# Patient Record
Sex: Female | Born: 1940 | Race: White | Hispanic: No | Marital: Married | Smoking: Never smoker
Health system: Southern US, Community
[De-identification: ages and names within clinical notes are randomized; demographics above are authoritative.]

## PROBLEM LIST (undated history)

## (undated) DIAGNOSIS — K623 Rectal prolapse: Secondary | ICD-10-CM

## (undated) DIAGNOSIS — E119 Type 2 diabetes mellitus without complications: Secondary | ICD-10-CM

## (undated) DIAGNOSIS — N390 Urinary tract infection, site not specified: Secondary | ICD-10-CM

## (undated) HISTORY — PX: BLADDER SUSPENSION: SHX72

## (undated) HISTORY — PX: ABDOMINAL HYSTERECTOMY: SHX81

## (undated) HISTORY — PX: CHOLECYSTECTOMY: SHX55

---

## 2004-03-25 ENCOUNTER — Ambulatory Visit (HOSPITAL_COMMUNITY): Admission: RE | Admit: 2004-03-25 | Discharge: 2004-03-25 | Payer: Self-pay | Admitting: Ophthalmology

## 2015-04-05 ENCOUNTER — Other Ambulatory Visit (HOSPITAL_COMMUNITY): Payer: Self-pay

## 2015-04-05 ENCOUNTER — Inpatient Hospital Stay (HOSPITAL_COMMUNITY)
Admission: EM | Admit: 2015-04-05 | Discharge: 2015-04-10 | DRG: 684 | Disposition: A | Discharge status: Home Health | Payer: Medicare PPO | Attending: Family Medicine | Admitting: Family Medicine

## 2015-04-05 ENCOUNTER — Inpatient Hospital Stay (HOSPITAL_COMMUNITY): Payer: Medicare PPO

## 2015-04-05 ENCOUNTER — Encounter (HOSPITAL_COMMUNITY): Payer: Self-pay | Admitting: Emergency Medicine

## 2015-04-05 DIAGNOSIS — R531 Weakness: Secondary | ICD-10-CM

## 2015-04-05 DIAGNOSIS — E1121 Type 2 diabetes mellitus with diabetic nephropathy: Secondary | ICD-10-CM | POA: Diagnosis present

## 2015-04-05 DIAGNOSIS — E11319 Type 2 diabetes mellitus with unspecified diabetic retinopathy without macular edema: Secondary | ICD-10-CM | POA: Diagnosis present

## 2015-04-05 DIAGNOSIS — Z794 Long term (current) use of insulin: Secondary | ICD-10-CM | POA: Diagnosis not present

## 2015-04-05 DIAGNOSIS — N179 Acute kidney failure, unspecified: Secondary | ICD-10-CM | POA: Diagnosis present

## 2015-04-05 DIAGNOSIS — E860 Dehydration: Secondary | ICD-10-CM | POA: Diagnosis present

## 2015-04-05 DIAGNOSIS — R600 Localized edema: Secondary | ICD-10-CM | POA: Diagnosis present

## 2015-04-05 DIAGNOSIS — G20 Parkinson's disease: Secondary | ICD-10-CM | POA: Diagnosis present

## 2015-04-05 DIAGNOSIS — R55 Syncope and collapse: Secondary | ICD-10-CM | POA: Diagnosis not present

## 2015-04-05 DIAGNOSIS — Z90710 Acquired absence of both cervix and uterus: Secondary | ICD-10-CM

## 2015-04-05 DIAGNOSIS — I951 Orthostatic hypotension: Secondary | ICD-10-CM | POA: Diagnosis present

## 2015-04-05 DIAGNOSIS — E1165 Type 2 diabetes mellitus with hyperglycemia: Secondary | ICD-10-CM | POA: Diagnosis present

## 2015-04-05 DIAGNOSIS — E11649 Type 2 diabetes mellitus with hypoglycemia without coma: Secondary | ICD-10-CM | POA: Diagnosis not present

## 2015-04-05 DIAGNOSIS — IMO0002 Reserved for concepts with insufficient information to code with codable children: Secondary | ICD-10-CM | POA: Diagnosis present

## 2015-04-05 HISTORY — DX: Rectal prolapse: K62.3

## 2015-04-05 HISTORY — DX: Type 2 diabetes mellitus without complications: E11.9

## 2015-04-05 HISTORY — DX: Urinary tract infection, site not specified: N39.0

## 2015-04-05 LAB — CBC WITH DIFFERENTIAL/PLATELET
BASOS ABS: 0 10*3/uL (ref 0.0–0.1)
BASOS PCT: 1 % (ref 0–1)
EOS PCT: 2 % (ref 0–5)
Eosinophils Absolute: 0.1 10*3/uL (ref 0.0–0.7)
HCT: 38.2 % (ref 36.0–46.0)
HEMOGLOBIN: 12.8 g/dL (ref 12.0–15.0)
LYMPHS ABS: 1.6 10*3/uL (ref 0.7–4.0)
LYMPHS PCT: 28 % (ref 12–46)
MCH: 31.2 pg (ref 26.0–34.0)
MCHC: 33.5 g/dL (ref 30.0–36.0)
MCV: 93.2 fL (ref 78.0–100.0)
Monocytes Absolute: 0.4 10*3/uL (ref 0.1–1.0)
Monocytes Relative: 6 % (ref 3–12)
NEUTROS ABS: 3.7 10*3/uL (ref 1.7–7.7)
NEUTROS PCT: 63 % (ref 43–77)
Platelets: 150 10*3/uL (ref 150–400)
RBC: 4.1 MIL/uL (ref 3.87–5.11)
RDW: 14.6 % (ref 11.5–15.5)
WBC: 5.8 10*3/uL (ref 4.0–10.5)

## 2015-04-05 LAB — URINALYSIS, ROUTINE W REFLEX MICROSCOPIC
Bilirubin Urine: NEGATIVE
Glucose, UA: 100 mg/dL — AB
HGB URINE DIPSTICK: NEGATIVE
Ketones, ur: NEGATIVE mg/dL
NITRITE: NEGATIVE
PH: 5 (ref 5.0–8.0)
Protein, ur: NEGATIVE mg/dL
Specific Gravity, Urine: 1.012 (ref 1.005–1.030)
UROBILINOGEN UA: 0.2 mg/dL (ref 0.0–1.0)

## 2015-04-05 LAB — COMPREHENSIVE METABOLIC PANEL
ALBUMIN: 3.8 g/dL (ref 3.5–5.0)
ALT: 22 U/L (ref 14–54)
ANION GAP: 11 (ref 5–15)
AST: 61 U/L — AB (ref 15–41)
Alkaline Phosphatase: 81 U/L (ref 38–126)
BILIRUBIN TOTAL: 0.2 mg/dL — AB (ref 0.3–1.2)
BUN: 54 mg/dL — ABNORMAL HIGH (ref 6–20)
CALCIUM: 10.2 mg/dL (ref 8.9–10.3)
CHLORIDE: 103 mmol/L (ref 101–111)
CO2: 24 mmol/L (ref 22–32)
CREATININE: 2.4 mg/dL — AB (ref 0.44–1.00)
GFR calc Af Amer: 22 mL/min — ABNORMAL LOW (ref >60)
GFR calc non Af Amer: 19 mL/min — ABNORMAL LOW (ref >60)
Glucose, Bld: 294 mg/dL — ABNORMAL HIGH (ref 65–99)
Potassium: 4.5 mmol/L (ref 3.5–5.1)
SODIUM: 138 mmol/L (ref 135–145)
Total Protein: 7.2 g/dL (ref 6.5–8.1)

## 2015-04-05 LAB — GLUCOSE, CAPILLARY: GLUCOSE-CAPILLARY: 154 mg/dL — AB (ref 65–99)

## 2015-04-05 LAB — URINE MICROSCOPIC-ADD ON

## 2015-04-05 MED ORDER — ENOXAPARIN SODIUM 40 MG/0.4ML ~~LOC~~ SOLN
40.0000 mg | SUBCUTANEOUS | Status: DC
  Administered 2015-04-05 – 2015-04-09 (×5): 40 mg via SUBCUTANEOUS
  Filled 2015-04-05 (×6): qty 0.4

## 2015-04-05 MED ORDER — SODIUM CHLORIDE 0.9 % IV BOLUS (SEPSIS)
1000.0000 mL | Freq: Once | INTRAVENOUS | Status: AC
  Administered 2015-04-05: 1000 mL via INTRAVENOUS

## 2015-04-05 MED ORDER — INSULIN ASPART 100 UNIT/ML ~~LOC~~ SOLN
0.0000 [IU] | Freq: Three times a day (TID) | SUBCUTANEOUS | Status: DC
  Administered 2015-04-06 (×2): 5 [IU] via SUBCUTANEOUS
  Administered 2015-04-07: 2 [IU] via SUBCUTANEOUS
  Administered 2015-04-07: 3 [IU] via SUBCUTANEOUS
  Administered 2015-04-07: 2 [IU] via SUBCUTANEOUS
  Administered 2015-04-08: 3 [IU] via SUBCUTANEOUS
  Administered 2015-04-08: 7 [IU] via SUBCUTANEOUS
  Administered 2015-04-08 – 2015-04-09 (×2): 3 [IU] via SUBCUTANEOUS
  Administered 2015-04-09: 5 [IU] via SUBCUTANEOUS
  Administered 2015-04-09 – 2015-04-10 (×2): 2 [IU] via SUBCUTANEOUS
  Administered 2015-04-10: 7 [IU] via SUBCUTANEOUS

## 2015-04-05 MED ORDER — ACETAMINOPHEN 325 MG PO TABS: 650.0000 mg | ORAL_TABLET | Freq: Four times a day (QID) | ORAL | Status: DC | PRN

## 2015-04-05 MED ORDER — SODIUM CHLORIDE 0.9 % IV SOLN
INTRAVENOUS | Status: AC
  Administered 2015-04-05: 23:00:00 via INTRAVENOUS

## 2015-04-05 MED ORDER — ONDANSETRON HCL 4 MG PO TABS: 4.0000 mg | ORAL_TABLET | Freq: Four times a day (QID) | ORAL | Status: DC | PRN

## 2015-04-05 MED ORDER — INSULIN DETEMIR 100 UNIT/ML ~~LOC~~ SOLN
45.0000 [IU] | Freq: Every day | SUBCUTANEOUS | Status: DC
  Administered 2015-04-05: 45 [IU] via SUBCUTANEOUS
  Filled 2015-04-05 (×2): qty 0.45

## 2015-04-05 MED ORDER — ACETAMINOPHEN 650 MG RE SUPP: 650.0000 mg | Freq: Four times a day (QID) | RECTAL | Status: DC | PRN

## 2015-04-05 MED ORDER — ONDANSETRON HCL 4 MG/2ML IJ SOLN: 4.0000 mg | Freq: Four times a day (QID) | INTRAMUSCULAR | Status: DC | PRN

## 2015-04-05 NOTE — ED Notes (Addendum)
Pt went to primary care MD this am, had labs drawn and was told to go to the hospital--pt has been weak for approx 1 month, not eating/drinking as much. Pt is having urinary frequency. Pt needs assistance in standing and walking-- in past month.

## 2015-04-05 NOTE — H&P (Addendum)
[facility] History and Physical  Cassandra Mortonsther Fahmy ZOX:[id number] DOB: 1941/06/17 DOA: 04/05/2015  Referring physician: Mr.Browning. PCP: No PCP Per Patient [facility]. Specialists: [facility].  Chief Complaint: Elevated creatinine.  HPI: Cassandra Thompson is a 74 y.o. 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 Patient's baseline creatinine as per the family was around 1.3. Patient was found to be having Around 2.4 which was confirmed in the ER. Patient states over the last few days patient has been feeling weak. Patient has been going to neurologist for gait imbalance. Patient's first visit was in January of this year and was referred to the physical therapist. Since patient's weakness persisted patient had gone to the neurologist again in April 21 of this year and as per the family had MRI of the brain which was unremarkable as per the patient's family. Patient was eventually placed on Sinemet on April 25, last month. Over the last 4 days patient is finding it increasingly difficult to ambulate generalized weakness. Patient required wheelchair to walk. Patient had gone to her PCP yesterday and had lab works done and was told to decrease her Sinemet dose. Lab work showed elevated creatinine and was referred to the ER. Patient denies any nausea vomiting abdominal pain fever chills headache visual symptoms or any loss of consciousness. On exam patient is able to move all extremities. And patient is orthostatic with blood pressure dropping from 155 systolic to 125 systolic. UA is unremarkable. As per the family patient's UA showed some blood cells at the PCPs office. Patient was treated for shingles last month. Patient has chronic lower extremity numbness  from diabetes. Patient also sees ophthalmologist for diabetic retinopathy and gets injections.  Review of Systems: As presented in the history of presenting illness, rest negative.  Past Medical History  Diagnosis Date  . Diabetes mellitus without complication   . UTI (lower urinary tract infection)   . Rectal prolapse    Past Surgical History  Procedure Laterality Date  . Abdominal hysterectomy    . Bladder suspension    . Cholecystectomy     Social History:  reports that she has never smoked. She does not have any smokeless tobacco history on file. She reports that she does not drink alcohol or use illicit drugs. Where does patient live at home. Can patient participate in ADLs? Yes.  Not on File  Family History:  Family History  Problem Relation Age of Onset  . CAD Mother   . Diabetes Mellitus II Father       Prior to Admission medications   Not on File    Physical Exam: Filed Vitals:   04/05/15 2049 04/05/15 2125 04/05/15 2127 04/05/15 2130  BP: 154/66 155/72 147/81 123/62  Pulse: 86 84 89 90  Temp: 98.3 F (36.8 C)     TempSrc: Oral     Resp: 18 18    SpO2: 97% 99% 99% 100%     General:  Well-developed and nourished. Patient appears dry.  Eyes: Anicteric no pallor.  ENT: No discharge from the ears eyes nose and mouth.  Neck: No mass felt. No JVD appreciated.  Cardiovascular: S1 and S2 heard.  Respiratory: No rhonchi or crepitations.  Abdomen: Soft nontender bowel sounds present.  Skin: Chronic skin changes in the lower  extremity.  Musculoskeletal: No edema.  Psychiatric: Appears normal.  Neurologic: Alert awake oriented to time place and person. Moves all extremities with 5 x 5 but has generalized weakness and patient finds it difficult to stand up. No facial asymmetry. PERRLA positive. Tongue is midline. Deep tendon reflexes are absent.  Labs on Admission:  Basic Metabolic Panel:  Recent Labs Lab 04/05/15 1520  NA 138  K 4.5  CL 103   CO2 24  GLUCOSE 294*  BUN 54*  CREATININE 2.40*  CALCIUM 10.2   Liver Function Tests:  Recent Labs Lab 04/05/15 1520  AST 61*  ALT 22  ALKPHOS 81  BILITOT 0.2*  PROT 7.2  ALBUMIN 3.8   No results for input(s): LIPASE, AMYLASE in the last 168 hours. No results for input(s): AMMONIA in the last 168 hours. CBC:  Recent Labs Lab 04/05/15 1520  WBC 5.8  NEUTROABS 3.7  HGB 12.8  HCT 38.2  MCV 93.2  PLT 150   Cardiac Enzymes: No results for input(s): CKTOTAL, CKMB, CKMBINDEX, TROPONINI in the last 168 hours.  BNP (last 3 results) No results for input(s): BNP in the last 8760 hours.  ProBNP (last 3 results) No results for input(s): PROBNP in the last 8760 hours.  CBG: No results for input(s): GLUCAP in the last 168 hours.  Radiological Exams on Admission: No results found.  EKG: Independently reviewed. Normal sinus rhythm with RBBB.  Assessment/Plan Active Problems:   AKI (acute kidney injury)   Diabetes mellitus type 2, uncontrolled   Generalized weakness   Orthostatic hypotension   Acute renal failure   1. Acute renal failure - patient is clearly orthostatic at this time and on exam patient is feeling weak and fatigued. I suspect patient's renal failure could be prerenal but patient also has history of retinopathy. At this time we will hold off patient's Lasix and gently hydrate. Patient did receive 1 L normal saline bolus in the ER. Check renal sonogram. Check urine creatinine and sodium. Urine does not show any proteinuria. 2. Generalized weakness been fatigue - as per the patient patient had normal MRI brain last month. Patient was placed on Sinemet last month by patient's neurologist. Patient's PCP had advised to decrease his Sinemet yesterday. Sinemet dose is unclear at this time. Patient is clearly orthostatic and suspect patient's weakness could be also from dehydration. At this time we will gently hydrate get physical therapy consult. Recheck  orthostatics after hydration. Will discuss with neurologist. Check TSH for hypothyroidism and CK levels for any myositis. 3. Diabetes mellitus type 2 uncontrolled - patient states her last hemoglobin A1c was around 7.5. At this time we'll continue home medication of intermittent but hold metformin due to renal failure and place patient on sliding scale coverage. Closely follow CBGs. 4. History of lower extremity edema - holding of Lasix due to renal failure.  Addendum - I have discussed with Dr. Hosie Poisson on call neurologist. Dr. Hosie Poisson has advised to continue Sinemet at the dose patient was taking and follow-up with neurologist. Patient on reexam as already started walking after hydration. Closely observe for now. Recheck CK levels as it was mildly elevated. Patient's family updated.   DVT Prophylaxis Lovenox.  Code Status: Full code.  Family Communication: Patient's husband and daughter.  Disposition Plan: Admit to inpatient. Likely stay 2-3 days.    Cardarius Senat N. [facility] Pager [phone].  If 7PM-7AM, please contact night-coverage [email or web address] Password [facility] 04/05/2015, 9:49 PM

## 2015-04-05 NOTE — ED Provider Notes (Signed)
CSN: [id number]     Arrival date & time 04/05/15  1401 History   First MD Initiated Contact with Patient 04/05/15 1644     Chief Complaint  Patient presents with  . Weakness  . abnormal labs      (Consider location/radiation/quality/duration/timing/severity/associated sxs/prior Treatment) HPI Comments: Patient presents to the emergency department with chief complaint of generalized fatigue. Patient had labs drawn by her primary care provider today, and was told to come to the emergency department. It was noted that her creatinine was nearly 1. higher than her baseline. She normally trends at 1.4 with a GFR of 50-60 per her primary care. Today, her creatinine is 2.4, GFR is 19, and patient reports having some blood in her urine. She denies any symptoms with the exception of generalized weakness and fatigue. She states that she has not been able to get around as easily. She denies chest pain, shortness of breath, nausea, vomiting, diarrhea, constipation, or dysuria. She states that she recently had her carbidopa-levodopa prescription changed about a month ago, but was doing well until about one week ago.  [facility]  The history is provided by the patient. No language interpreter was used.    Past Medical History  Diagnosis Date  . Diabetes mellitus without complication   . UTI (lower urinary tract infection)   . Rectal prolapse    Past Surgical History  Procedure Laterality Date  . Abdominal hysterectomy    . Bladder suspension    . Cholecystectomy     No family history on file. History  Substance Use Topics  . Smoking status: Never Smoker   . Smokeless tobacco: Not on file  . Alcohol Use: No   OB History    No data available     Review of Systems  Constitutional: Positive for fatigue. Negative for fever and chills.  Respiratory: Negative for shortness of breath.   Cardiovascular: Negative for chest pain.  Gastrointestinal: Negative for nausea,  vomiting, diarrhea and constipation.  Genitourinary: Negative for dysuria.  All other systems reviewed and are negative.     Allergies  Review of patient's allergies indicates not on file.  Home Medications   Prior to Admission medications   Not on File   BP 100/51 mmHg  Pulse 87  Temp(Src) 99 F (37.2 C) (Oral)  Resp 16  SpO2 92% Physical Exam  Constitutional: She is oriented to person, place, and time. She appears well-developed and well-nourished.  HENT:  Head: Normocephalic and atraumatic.  Eyes: Conjunctivae and EOM are normal. Pupils are equal, round, and reactive to light.  Neck: Normal range of motion. Neck supple.  Cardiovascular: Normal rate and regular rhythm.  Exam reveals no gallop and no friction rub.   No murmur heard. Pulmonary/Chest: Effort normal and breath sounds normal. No respiratory distress. She has no wheezes. She has no rales. She exhibits no tenderness.  Abdominal: Soft. Bowel sounds are normal. She exhibits no distension and no mass. There is no tenderness. There is no rebound and no guarding.  Musculoskeletal: Normal range of motion. She exhibits no edema or tenderness.  Neurological: She is alert and oriented to person, place, and time.  Skin: Skin is warm and dry.  Psychiatric: She has a normal mood and affect. Her behavior is normal. Judgment and thought content normal.  Nursing note and vitals reviewed.   ED Course  Procedures (including critical care time) Results for orders placed or performed during the hospital encounter of 04/05/15  CBC  with Differential  Result Value Ref Range   WBC 5.8 4.0 - 10.5 K/uL   RBC 4.10 3.87 - 5.11 MIL/uL   Hemoglobin 12.8 12.0 - 15.0 g/dL   HCT 40.9 81.1 - 91.4 %   MCV 93.2 78.0 - 100.0 fL   MCH 31.2 26.0 - 34.0 pg   MCHC 33.5 30.0 - 36.0 g/dL   RDW 78.2 95.6 - 21.3 %   Platelets 150 150 - 400 K/uL   Neutrophils Relative % 63 43 - 77 %   Neutro Abs 3.7 1.7 - 7.7 K/uL   Lymphocytes Relative 28 12 -  46 %   Lymphs Abs 1.6 0.7 - 4.0 K/uL   Monocytes Relative 6 3 - 12 %   Monocytes Absolute 0.4 0.1 - 1.0 K/uL   Eosinophils Relative 2 0 - 5 %   Eosinophils Absolute 0.1 0.0 - 0.7 K/uL   Basophils Relative 1 0 - 1 %   Basophils Absolute 0.0 0.0 - 0.1 K/uL  Comprehensive metabolic panel  Result Value Ref Range   Sodium 138 135 - 145 mmol/L   Potassium 4.5 3.5 - 5.1 mmol/L   Chloride 103 101 - 111 mmol/L   CO2 24 22 - 32 mmol/L   Glucose, Bld 294 (H) 65 - 99 mg/dL   BUN 54 (H) 6 - 20 mg/dL   Creatinine, Ser 0.86 (H) 0.44 - 1.00 mg/dL   Calcium 57.8 8.9 - 46.9 mg/dL   Total Protein 7.2 6.5 - 8.1 g/dL   Albumin 3.8 3.5 - 5.0 g/dL   AST 61 (H) 15 - 41 U/L   ALT 22 14 - 54 U/L   Alkaline Phosphatase 81 38 - 126 U/L   Total Bilirubin 0.2 (L) 0.3 - 1.2 mg/dL   GFR calc non Af Amer 19 (L) >60 mL/min   GFR calc Af Amer 22 (L) >60 mL/min   Anion gap 11 5 - 15  Urinalysis, Routine w reflex microscopic  Result Value Ref Range   Color, Urine YELLOW YELLOW   APPearance CLOUDY (A) CLEAR   Specific Gravity, Urine 1.012 1.005 - 1.030   pH 5.0 5.0 - 8.0   Glucose, UA 100 (A) NEGATIVE mg/dL   Hgb urine dipstick NEGATIVE NEGATIVE   Bilirubin Urine NEGATIVE NEGATIVE   Ketones, ur NEGATIVE NEGATIVE mg/dL   Protein, ur NEGATIVE NEGATIVE mg/dL   Urobilinogen, UA 0.2 0.0 - 1.0 mg/dL   Nitrite NEGATIVE NEGATIVE   Leukocytes, UA MODERATE (A) NEGATIVE  Urine microscopic-add on  Result Value Ref Range   Squamous Epithelial / LPF RARE RARE   WBC, UA 0-2 <3 WBC/hpf   Bacteria, UA MANY (A) RARE   Urine-Other RARE YEAST    No results found.      EKG Interpretation None      MDM   Final diagnoses:  AKI (acute kidney injury)    Fairly well-appearing 74 year old female with reported laboratory abnormality. Creatinine normally runs 1.4. Today it is 2.4. GFR is normally 50-60, today it is 19. Also reported hematuria. Urinalysis is still pending. We'll give fluids, and will  reassess.  Patient discussed with Dr. Donnald Garre, who recommends admission for AKI.  Will continue fluids.  Patient discussed with Dr. Toniann Fail, who will admit the patient.  Renal US is pending.   Roxy Horseman, PA-C 04/05/15 2007  Arby Barrette, MD 04/06/15 [id number]

## 2015-04-06 DIAGNOSIS — R531 Weakness: Secondary | ICD-10-CM

## 2015-04-06 DIAGNOSIS — E1165 Type 2 diabetes mellitus with hyperglycemia: Secondary | ICD-10-CM

## 2015-04-06 DIAGNOSIS — I951 Orthostatic hypotension: Secondary | ICD-10-CM

## 2015-04-06 DIAGNOSIS — N179 Acute kidney failure, unspecified: Principal | ICD-10-CM

## 2015-04-06 LAB — CBC WITH DIFFERENTIAL/PLATELET
Basophils Absolute: 0 10*3/uL (ref 0.0–0.1)
Basophils Relative: 0 % (ref 0–1)
Eosinophils Absolute: 0.1 10*3/uL (ref 0.0–0.7)
Eosinophils Relative: 2 % (ref 0–5)
HCT: 37.3 % (ref 36.0–46.0)
Hemoglobin: 12.5 g/dL (ref 12.0–15.0)
LYMPHS ABS: 2.3 10*3/uL (ref 0.7–4.0)
Lymphocytes Relative: 44 % (ref 12–46)
MCH: 30.9 pg (ref 26.0–34.0)
MCHC: 33.5 g/dL (ref 30.0–36.0)
MCV: 92.1 fL (ref 78.0–100.0)
MONOS PCT: 9 % (ref 3–12)
Monocytes Absolute: 0.5 10*3/uL (ref 0.1–1.0)
NEUTROS PCT: 45 % (ref 43–77)
Neutro Abs: 2.3 10*3/uL (ref 1.7–7.7)
Platelets: 142 10*3/uL — ABNORMAL LOW (ref 150–400)
RBC: 4.05 MIL/uL (ref 3.87–5.11)
RDW: 14.7 % (ref 11.5–15.5)
WBC: 5.1 10*3/uL (ref 4.0–10.5)

## 2015-04-06 LAB — COMPREHENSIVE METABOLIC PANEL
ALK PHOS: 65 U/L (ref 38–126)
ALT: 40 U/L (ref 14–54)
ANION GAP: 8 (ref 5–15)
AST: 47 U/L — ABNORMAL HIGH (ref 15–41)
Albumin: 3.4 g/dL — ABNORMAL LOW (ref 3.5–5.0)
BUN: 41 mg/dL — AB (ref 6–20)
CALCIUM: 9.3 mg/dL (ref 8.9–10.3)
CO2: 26 mmol/L (ref 22–32)
CREATININE: 1.71 mg/dL — AB (ref 0.44–1.00)
Chloride: 107 mmol/L (ref 101–111)
GFR calc Af Amer: 33 mL/min — ABNORMAL LOW (ref >60)
GFR, EST NON AFRICAN AMERICAN: 29 mL/min — AB (ref >60)
GLUCOSE: 75 mg/dL (ref 65–99)
Potassium: 3.9 mmol/L (ref 3.5–5.1)
Sodium: 141 mmol/L (ref 135–145)
TOTAL PROTEIN: 6.5 g/dL (ref 6.5–8.1)
Total Bilirubin: 0.5 mg/dL (ref 0.3–1.2)

## 2015-04-06 LAB — CBC
HCT: 35.5 % — ABNORMAL LOW (ref 36.0–46.0)
HEMOGLOBIN: 12 g/dL (ref 12.0–15.0)
MCH: 31.2 pg (ref 26.0–34.0)
MCHC: 33.8 g/dL (ref 30.0–36.0)
MCV: 92.2 fL (ref 78.0–100.0)
PLATELETS: 136 10*3/uL — AB (ref 150–400)
RBC: 3.85 MIL/uL — ABNORMAL LOW (ref 3.87–5.11)
RDW: 14.6 % (ref 11.5–15.5)
WBC: 5.7 10*3/uL (ref 4.0–10.5)

## 2015-04-06 LAB — FOLATE: Folate: 35 ng/mL (ref >5.9)

## 2015-04-06 LAB — CREATININE, SERUM
Creatinine, Ser: 1.95 mg/dL — ABNORMAL HIGH (ref 0.44–1.00)
GFR calc Af Amer: 28 mL/min — ABNORMAL LOW (ref >60)
GFR calc non Af Amer: 24 mL/min — ABNORMAL LOW (ref >60)

## 2015-04-06 LAB — SODIUM, URINE, RANDOM: Sodium, Ur: 68 mmol/L

## 2015-04-06 LAB — VITAMIN B12: Vitamin B-12: 3068 pg/mL — ABNORMAL HIGH (ref 180–914)

## 2015-04-06 LAB — CK
Total CK: 834 U/L — ABNORMAL HIGH (ref 38–234)
Total CK: 820 U/L — ABNORMAL HIGH (ref 38–234)

## 2015-04-06 LAB — GLUCOSE, CAPILLARY
GLUCOSE-CAPILLARY: 252 mg/dL — AB (ref 65–99)
Glucose-Capillary: 57 mg/dL — ABNORMAL LOW (ref 65–99)
Glucose-Capillary: 82 mg/dL (ref 65–99)
Glucose-Capillary: 256 mg/dL — ABNORMAL HIGH (ref 65–99)
Glucose-Capillary: 209 mg/dL — ABNORMAL HIGH (ref 65–99)

## 2015-04-06 LAB — CREATININE, URINE, RANDOM: Creatinine, Urine: 54.72 mg/dL

## 2015-04-06 LAB — TSH: TSH: 2.686 u[IU]/mL (ref 0.350–4.500)

## 2015-04-06 LAB — TROPONIN I

## 2015-04-06 LAB — LACTIC ACID, PLASMA: Lactic Acid, Venous: 0.8 mmol/L (ref 0.5–2.0)

## 2015-04-06 LAB — SEDIMENTATION RATE: Sed Rate: 42 mm/hr — ABNORMAL HIGH (ref 0–22)

## 2015-04-06 MED ORDER — INSULIN DETEMIR 100 UNIT/ML ~~LOC~~ SOLN
20.0000 [IU] | Freq: Every day | SUBCUTANEOUS | Status: DC
  Administered 2015-04-06 – 2015-04-10 (×5): 20 [IU] via SUBCUTANEOUS
  Filled 2015-04-06 (×5): qty 0.2

## 2015-04-06 NOTE — Progress Notes (Signed)
Inpatient Diabetes Program Recommendations  AACE/ADA: New Consensus Statement on Inpatient Glycemic Control (2013)  Target Ranges:  Prepandial:   less than 140 mg/dL      Peak postprandial:   less than 180 mg/dL (1-2 hours)      Critically ill patients:  140 - 180 mg/dL  Results for Norval MortonBENNETT, Adanely (MRN [id number]) as of 04/06/2015 10:59  Ref. Range 04/05/2015 22:27 04/06/2015 07:54 04/06/2015 08:27  Glucose-Capillary Latest Ref Range: 65-99 mg/dL 409154 (H) 57 (L) 82   Diabetes history: DM 2 Outpatient Diabetes medications:  Current orders for Inpatient glycemic control: Levemir 45 units QHS, Novolog 0-9 TID  Inpatient Diabetes Program Recommendations Insulin - Basal: Patient had hypoglycemia this am. Please consider decreasing basal insulin to Levemir 40 units QHS.  Thanks,  Christena DeemShannon Marlyce Mcdougald RN, MSN, [facility] Inpatient Diabetes Coordinator Team Pager [phone]

## 2015-04-06 NOTE — Progress Notes (Signed)
[facility] PROGRESS NOTE  Cassandra Thompson ZOX:[id number] DOB: [birth year] DOA: 04/05/2015 PCP: No PCP Per Patient  Assessment/Plan:  Active Problems:   AKI (acute kidney injury) - Improving with improved oral intake and IV fluid rehydration    Diabetes mellitus type 2, uncontrolled -Continue sliding scale insulin and will decrease Levemir dose given recent hypoglycemic value - Will place order for diabetic diet    Generalized weakness - Most likely due to dehydration - agree with PT evaluation    Orthostatic hypotension - Will reassess next a.m. Suspect improvement with continued improvement in oral intake and IV fluid rehydration   Code Status: full Family Communication: Discussed with patient and family at bedside Disposition Plan: Pending improvement in condition, with continued improvement most likely discharge within the next day or 2   Consultants:  None  Procedures:  None  Antibiotics:  None  HPI/Subjective: Patient states she feels better but otherwise has no new complaints. Nursing reported hypoglycemic value  Objective: Filed Vitals:   04/06/15 1353  BP: 151/72  Pulse: 79  Temp: 98.2 F (36.8 C)  Resp: 16    Intake/Output Summary (Last 24 hours) at 04/06/15 1754 Last data filed at 04/06/15 1519  Gross per 24 hour  Intake 2127.5 ml  Output   1050 ml  Net 1077.5 ml   There were no vitals filed for this visit.  Exam:   General:  Patient in no acute distress, alert and awake  Cardiovascular: Regular rate and rhythm, no murmurs or rubs  Respiratory: Clear to auscultation bilaterally, no wheezes  Abdomen: Soft, nondistended, nontender  Musculoskeletal: No cyanosis or clubbing   Data Reviewed: Basic Metabolic Panel:  Recent Labs Lab 04/05/15 1520 04/06/15 0053 04/06/15 0652  NA 138  --  141  K 4.5  --  3.9  CL 103  --  107  CO2 24  --  26  GLUCOSE 294*  --  75  BUN 54*  --  41*  CREATININE 2.40* 1.95* 1.71*  CALCIUM 10.2   --  9.3   Liver Function Tests:  Recent Labs Lab 04/05/15 1520 04/06/15 0652  AST 61* 47*  ALT 22 40  ALKPHOS 81 65  BILITOT 0.2* 0.5  PROT 7.2 6.5  ALBUMIN 3.8 3.4*   No results for input(s): LIPASE, AMYLASE in the last 168 hours. No results for input(s): AMMONIA in the last 168 hours. CBC:  Recent Labs Lab 04/05/15 1520 04/06/15 0053 04/06/15 0652  WBC 5.8 5.7 5.1  NEUTROABS 3.7  --  2.3  HGB 12.8 12.0 12.5  HCT 38.2 35.5* 37.3  MCV 93.2 92.2 92.1  PLT 150 136* 142*   Cardiac Enzymes:  Recent Labs Lab 04/06/15 0053 04/06/15 0700  CKTOTAL 834* 820*  TROPONINI <0.03  --    BNP (last 3 results) No results for input(s): BNP in the last 8760 hours.  ProBNP (last 3 results) No results for input(s): PROBNP in the last 8760 hours.  CBG:  Recent Labs Lab 04/05/15 2227 04/06/15 0754 04/06/15 0827 04/06/15 1233 04/06/15 1701  GLUCAP 154* 57* 82 256* 252*    No results found for this or any previous visit (from the past 240 hour(s)).   Studies: [place]  04/06/2015   CLINICAL DATA:  Acute kidney injury  EXAM: RENAL / URINARY TRACT ULTRASOUND COMPLETE  COMPARISON:  Abdominal CT 10/25/2014  FINDINGS: Right Kidney:  Length: 10 cm. Echogenicity within normal limits. No mass or hydronephrosis visualized. Color Doppler not applied.  Left Kidney:  Length: 10 cm. Echogenicity within normal limits. No mass or hydronephrosis visualized. Color Doppler not applied.  Bladder:  1000 cc bladder volume.  No wall thickening or internal debris.  IMPRESSION: 1. Negative renal ultrasound. 2. 1000 cc bladder volume.   Electronically Signed   By: Marnee Spring M.D.   On: 04/06/2015 01:22    Scheduled Meds: . enoxaparin (LOVENOX) injection  40 mg Subcutaneous Q24H  . insulin aspart  0-9 Units Subcutaneous TID WC  . insulin detemir  20 Units Subcutaneous Daily   Continuous Infusions: . sodium chloride 75 mL/hr at 04/05/15 2234    Time spent: > 35 minutes    [name]  [facility] Pager [phone]. If 7PM-7AM, please contact night-coverage at [email or web address], password [facility] 04/06/2015, 5:54 PM  LOS: 1 day

## 2015-04-06 NOTE — Progress Notes (Signed)
Utilization review completed.  

## 2015-04-06 NOTE — Evaluation (Signed)
Physical Therapy Evaluation Patient Details Name: Cassandra Thompson MRN: [id number] DOB: 09-Apr-1941 Today's Date: 04/06/2015   History of Present Illness  Cassandra Mortonsther Hickling is a 74 y.o. 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 Patient's baseline creatinine as per the family was around 1.3. Patient was found to be having Around 2.4 which was confirmed in the ER. Patient states over the last few days patient has been feeling weak. Patient has been going to neurologist for gait imbalance  Clinical Impression  Pt walked toileted on BSC.  Her husband helps her with all mobilty - lifts her etc.  I encouraged her to do what she can and husband to let her do what she can do and keep her safe.  Pt walked 60 feet with RW and chair following for safety.  Pt with fear of falling and overall weakness - feeling like legs are going to buckle.  We talked about safe ways for her to work on getting her strength back with husbands assist.  Pt would benefit from HHPT at discharge.    Follow Up Recommendations Home health PT;Supervision/Assistance - 24 hour (pt and husband feel comfortable with pt going home as long as husband can he lp)    Equipment Recommendations  None recommended by PT    Recommendations for Other Services       Precautions / Restrictions Precautions Precautions: Fall Precaution Comments: Pt denies recent falls Restrictions Weight Bearing Restrictions: No      Mobility  Bed Mobility Overal bed mobility: Needs Assistance             General bed mobility comments: pt sat up with min assist of husband and HOB raised  Transfers Overall transfer level: Needs assistance Equipment used: Rolling walker (2 wheeled) Transfers: Sit to/from Stand           General  transfer comment: husband tends to pull her up from any surface and pt lets him do all the work.  I had her push up to standing and she needed min assist with max verbal cues for technque.  pt not comfortable with any standing that her husband isnt helping her.  Ambulation/Gait Ambulation/Gait assistance: Min assist (chair following pt) Ambulation Distance (Feet): 60 Feet Assistive device: Rolling walker (2 wheeled)       General Gait Details: pt with short shuffling steps. she slides her feet and decreased stride length on left  Stairs            Wheelchair Mobility    Modified Rankin (Stroke Patients Only)       Balance Overall balance assessment: Needs assistance             Standing balance comment: pt not comfortabel at al l standing and not holding on.  she was loosing her balance posterior - unable to lift her head when stnading - looks at the ground.  pt did heelcord stretches and this helped her standign balance - encouraged her to practice t his in safe setting with husband present                             Pertinent Vitals/Pain Pain Assessment: No/denies pain    Home Living Family/patient expects to be discharged to:: Private residence  Living Arrangements: Spouse/significant other Available Help at Discharge: Family Type of Home: House Home Access: Level entry     Home Layout: One level Home Equipment: Environmental consultant - 2 wheels;Wheelchair - manual      Prior Function Level of Independence: Needs assistance   Gait / Transfers Assistance Needed: pt was walking h olding onto furniture or husband - weaker in last week and using WC more.  pt was using wheelchair in community     Comments: Pt was participating in OPPT until month and half ago.  then diagnosed with parkinsons and then got weaker     Hand Dominance        Extremity/Trunk Assessment   Upper Extremity Assessment: Generalized weakness           Lower Extremity Assessment:  Generalized weakness (Pt said left leg her strongest - they tested same - grossly 3+/5)      Cervical / Trunk Assessment: Normal  Communication   Communication: No difficulties  Cognition Arousal/Alertness: Awake/alert Behavior During Therapy: WFL for tasks assessed/performed (fearful of falling)                        General Comments General comments (skin integrity, edema, etc.): pt has neuropathy and decreased sensation in both legs from knees down    Exercises        Assessment/Plan    PT Assessment    PT Diagnosis Difficulty walking;Generalized weakness (balance issues and fear of falling)   PT Problem List    PT Treatment Interventions     PT Goals (Current goals can be found in the Care Plan section) Acute Rehab PT Goals Patient Stated Goal: pt get stronger and get back home and abel to walk PT Goal Formulation: With patient/family Time For Goal Achievement: 04/20/15 Potential to Achieve Goals: Good    Frequency     Barriers to discharge        Co-evaluation               End of Session Equipment Utilized During Treatment: Gait belt (wheelchair following closely) Activity Tolerance: Other (comment);Patient limited by fatigue (limited by pts fear of falling) Patient left: in bed;with call bell/phone within reach;with family/visitor present Nurse Communication: Mobility status         Time: 1420-1455 PT Time Calculation (min) (ACUTE ONLY): 35 min   Charges:   PT Evaluation $Initial PT Evaluation Tier I: 1 Procedure PT Treatments $Gait Training: 23-37 mins   PT G Codes:        Judson Roch 04/06/2015, 3:11 PM 04/06/2015   Ranae Palms, PT

## 2015-04-07 LAB — BASIC METABOLIC PANEL
ANION GAP: 9 (ref 5–15)
BUN: 21 mg/dL — AB (ref 6–20)
CALCIUM: 8.6 mg/dL — AB (ref 8.9–10.3)
CHLORIDE: 103 mmol/L (ref 101–111)
CO2: 27 mmol/L (ref 22–32)
Creatinine, Ser: 1.52 mg/dL — ABNORMAL HIGH (ref 0.44–1.00)
GFR, EST AFRICAN AMERICAN: 38 mL/min — AB (ref >60)
GFR, EST NON AFRICAN AMERICAN: 33 mL/min — AB (ref >60)
Glucose, Bld: 261 mg/dL — ABNORMAL HIGH (ref 65–99)
Potassium: 4.2 mmol/L (ref 3.5–5.1)
Sodium: 139 mmol/L (ref 135–145)

## 2015-04-07 LAB — GLUCOSE, CAPILLARY
GLUCOSE-CAPILLARY: 170 mg/dL — AB (ref 65–99)
GLUCOSE-CAPILLARY: 226 mg/dL — AB (ref 65–99)
Glucose-Capillary: 188 mg/dL — ABNORMAL HIGH (ref 65–99)
Glucose-Capillary: 281 mg/dL — ABNORMAL HIGH (ref 65–99)

## 2015-04-07 LAB — HEMOGLOBIN A1C
Hgb A1c MFr Bld: 8.4 % — ABNORMAL HIGH (ref 4.8–5.6)
MEAN PLASMA GLUCOSE: 194 mg/dL

## 2015-04-07 LAB — CK: Total CK: 503 U/L — ABNORMAL HIGH (ref 38–234)

## 2015-04-07 MED ORDER — SODIUM CHLORIDE 0.9 % IV SOLN
INTRAVENOUS | Status: DC
  Administered 2015-04-08 – 2015-04-09 (×2): via INTRAVENOUS

## 2015-04-07 NOTE — Progress Notes (Signed)
[facility] PROGRESS NOTE  [name] WUJ:[id number] DOB: 1941/07/16 DOA: 04/05/2015 PCP: No PCP Per Patient  Assessment/Plan:  Active Problems:   AKI (acute kidney injury) - Improving with improved oral intake and IV fluid rehydration - We'll continue IV fluid rehydration    Diabetes mellitus type 2, uncontrolled -Continue sliding scale insulin and will decrease Levemir dose given recent hypoglycemic value - Will place order for diabetic diet    Generalized weakness - Most likely due to dehydration and orthostatic blood pressure changes - agree with PT evaluation    Orthostatic hypotension - Still orthostatic as such will continue IV fluid hydration and have encouraged patient to take more oral intake.   Code Status: full Family Communication: Discussed with patient and family at bedside Disposition Plan: Pending improvement in condition, with continued improvement most likely discharge within the next day or 2   Consultants:  None  Procedures:  None  Antibiotics:  None  HPI/Subjective: Patient complaining of dizziness with standing  Objective: Filed Vitals:   04/07/15 1224  BP: 86/51  Pulse: 90  Temp:   Resp:     Intake/Output Summary (Last 24 hours) at 04/07/15 1642 Last data filed at 04/07/15 1402  Gross per 24 hour  Intake    702 ml  Output   2200 ml  Net  -1498 ml   Filed Weights   04/06/15 2109 04/07/15 0535  Weight: 76.3 kg (168 lb 3.4 oz) 75.7 kg (166 lb 14.2 oz)    Exam:   General:  Patient in no acute distress, alert and awake, dry mucous membranes  Cardiovascular: Regular rate and rhythm, no murmurs or rubs  Respiratory: Clear to auscultation bilaterally, no wheezes  Abdomen: Soft, nondistended, nontender  Musculoskeletal: No cyanosis or clubbing   Data Reviewed: Basic Metabolic Panel:  Recent Labs Lab 04/05/15 1520 04/06/15 0053 04/06/15 0652 04/07/15 1409  NA 138  --  141 139  K 4.5  --  3.9 4.2  CL 103   --  107 103  CO2 24  --  26 27  GLUCOSE 294*  --  75 261*  BUN 54*  --  41* 21*  CREATININE 2.40* 1.95* 1.71* 1.52*  CALCIUM 10.2  --  9.3 8.6*   Liver Function Tests:  Recent Labs Lab 04/05/15 1520 04/06/15 0652  AST 61* 47*  ALT 22 40  ALKPHOS 81 65  BILITOT 0.2* 0.5  PROT 7.2 6.5  ALBUMIN 3.8 3.4*   No results for input(s): LIPASE, AMYLASE in the last 168 hours. No results for input(s): AMMONIA in the last 168 hours. CBC:  Recent Labs Lab 04/05/15 1520 04/06/15 0053 04/06/15 0652  WBC 5.8 5.7 5.1  NEUTROABS 3.7  --  2.3  HGB 12.8 12.0 12.5  HCT 38.2 35.5* 37.3  MCV 93.2 92.2 92.1  PLT 150 136* 142*   Cardiac Enzymes:  Recent Labs Lab 04/06/15 0053 04/06/15 0700 04/07/15 0435  CKTOTAL 834* 820* 503*  TROPONINI <0.03  --   --    BNP (last 3 results) No results for input(s): BNP in the last 8760 hours.  ProBNP (last 3 results) No results for input(s): PROBNP in the last 8760 hours.  CBG:  Recent Labs Lab 04/06/15 1233 04/06/15 1701 04/06/15 2106 04/07/15 0758 04/07/15 1216  GLUCAP 256* 252* 209* 170* 226*    No results found for this or any previous visit (from the past 240 hour(s)).   Studies: [place] [facility]  04/06/2015   CLINICAL DATA:  Acute kidney  injury  EXAM: RENAL / URINARY TRACT ULTRASOUND COMPLETE  COMPARISON:  Abdominal CT 10/25/2014  FINDINGS: Right Kidney:  Length: 10 cm. Echogenicity within normal limits. No mass or hydronephrosis visualized. Color Doppler not applied.  Left Kidney:  Length: 10 cm. Echogenicity within normal limits. No mass or hydronephrosis visualized. Color Doppler not applied.  Bladder:  1000 cc bladder volume.  No wall thickening or internal debris.  IMPRESSION: 1. Negative renal ultrasound. 2. 1000 cc bladder volume.   Electronically Signed   By: Marnee Spring M.D.   On: 04/06/2015 01:22    Scheduled Meds: . enoxaparin (LOVENOX) injection  40 mg Subcutaneous Q24H  . insulin aspart  0-9 Units Subcutaneous TID WC   . insulin detemir  20 Units Subcutaneous Daily   Continuous Infusions: . sodium chloride      Time spent: > 35 minutes    Cassandra Thompson  [facility] Pager [phone]. If 7PM-7AM, please contact night-coverage at [email or web address], password [facility] 04/07/2015, 4:42 PM  LOS: 2 days

## 2015-04-08 DIAGNOSIS — E860 Dehydration: Secondary | ICD-10-CM

## 2015-04-08 LAB — GLUCOSE, CAPILLARY
GLUCOSE-CAPILLARY: 228 mg/dL — AB (ref 65–99)
GLUCOSE-CAPILLARY: 252 mg/dL — AB (ref 65–99)
Glucose-Capillary: 330 mg/dL — ABNORMAL HIGH (ref 65–99)
Glucose-Capillary: 222 mg/dL — ABNORMAL HIGH (ref 65–99)

## 2015-04-08 LAB — CK: Total CK: 217 U/L (ref 38–234)

## 2015-04-08 NOTE — Progress Notes (Signed)
[facility] PROGRESS NOTE  Cassandra Thompson ZOX:[id number] DOB: Oct 23, 1941 DOA: 04/05/2015 PCP: No PCP Per Patient  Assessment/Plan:  Active Problems:   AKI (acute kidney injury) - Improving with improved oral intake and IV fluid rehydration - We'll continue IV fluid rehydration  Dehydration - Will continue IVF's - still having elevated BUN/Creatinine ratio    Diabetes mellitus type 2, uncontrolled -Continue sliding scale insulin and will decrease Levemir dose given recent hypoglycemic value - Will place order for diabetic diet    Generalized weakness - Most likely due to dehydration and orthostatic blood pressure changes - agree with PT evaluation    Orthostatic hypotension - Still orthostatic will continue IVF rehydration - suspect dehydration contributing - will obtain Echocardiogram - place ted hose - reassess orthostatics next am.   Code Status: full Family Communication: Discussed with patient and family at bedside Disposition Plan: Pending improvement in condition, with continued improvement most likely discharge within the next 1 or 2 days. Still orthostatic   Consultants:  None  Procedures:  None  Antibiotics:  None  HPI/Subjective: Patient has no new complaints today  Objective: Filed Vitals:   04/08/15 1226  BP: 87/49  Pulse: 81  Temp:   Resp: 18    Intake/Output Summary (Last 24 hours) at 04/08/15 1240 Last data filed at 04/08/15 0947  Gross per 24 hour  Intake    582 ml  Output   1000 ml  Net   -418 ml   Filed Weights   04/06/15 2109 04/07/15 0535 04/08/15 0512  Weight: 76.3 kg (168 lb 3.4 oz) 75.7 kg (166 lb 14.2 oz) 74.6 kg (164 lb 7.4 oz)    Exam:   General:  Patient in no acute distress, alert and awake, dry mucous membranes  Cardiovascular: Regular rate and rhythm, no murmurs or rubs  Respiratory: Clear to auscultation bilaterally, no wheezes  Abdomen: Soft, nondistended, nontender  Musculoskeletal: No cyanosis  or clubbing   Data Reviewed: Basic Metabolic Panel:  Recent Labs Lab 04/05/15 1520 04/06/15 0053 04/06/15 0652 04/07/15 1409  NA 138  --  141 139  K 4.5  --  3.9 4.2  CL 103  --  107 103  CO2 24  --  26 27  GLUCOSE 294*  --  75 261*  BUN 54*  --  41* 21*  CREATININE 2.40* 1.95* 1.71* 1.52*  CALCIUM 10.2  --  9.3 8.6*   Liver Function Tests:  Recent Labs Lab 04/05/15 1520 04/06/15 0652  AST 61* 47*  ALT 22 40  ALKPHOS 81 65  BILITOT 0.2* 0.5  PROT 7.2 6.5  ALBUMIN 3.8 3.4*   No results for input(s): LIPASE, AMYLASE in the last 168 hours. No results for input(s): AMMONIA in the last 168 hours. CBC:  Recent Labs Lab 04/05/15 1520 04/06/15 0053 04/06/15 0652  WBC 5.8 5.7 5.1  NEUTROABS 3.7  --  2.3  HGB 12.8 12.0 12.5  HCT 38.2 35.5* 37.3  MCV 93.2 92.2 92.1  PLT 150 136* 142*   Cardiac Enzymes:  Recent Labs Lab 04/06/15 0053 04/06/15 0700 04/07/15 0435 04/08/15 0625  CKTOTAL 834* 820* 503* 217  TROPONINI <0.03  --   --   --    BNP (last 3 results) No results for input(s): BNP in the last 8760 hours.  ProBNP (last 3 results) No results for input(s): PROBNP in the last 8760 hours.  CBG:  Recent Labs Lab 04/07/15 1216 04/07/15 1738 04/07/15 2139 04/08/15 0804 04/08/15 1216  GLUCAP 226*  188* 281* 228* 252*    No results found for this or any previous visit (from the past 240 hour(s)).   Studies: No results found.  Scheduled Meds: . enoxaparin (LOVENOX) injection  40 mg Subcutaneous Q24H  . insulin aspart  0-9 Units Subcutaneous TID WC  . insulin detemir  20 Units Subcutaneous Daily   Continuous Infusions: . sodium chloride Stopped (04/07/15 1808)    Time spent: > 35 minutes    [name]  [facility] Pager [phone]. If 7PM-7AM, please contact night-coverage at [email or web address], password [facility] 04/08/2015, 12:40 PM  LOS: 3 days

## 2015-04-08 NOTE — Progress Notes (Signed)
Notified MD of trending CBG's.

## 2015-04-09 ENCOUNTER — Inpatient Hospital Stay (HOSPITAL_COMMUNITY): Payer: Medicare PPO

## 2015-04-09 DIAGNOSIS — R55 Syncope and collapse: Secondary | ICD-10-CM

## 2015-04-09 LAB — BASIC METABOLIC PANEL
ANION GAP: 7 (ref 5–15)
BUN: 16 mg/dL (ref 6–20)
CO2: 23 mmol/L (ref 22–32)
Calcium: 8.5 mg/dL — ABNORMAL LOW (ref 8.9–10.3)
Chloride: 110 mmol/L (ref 101–111)
Creatinine, Ser: 1.19 mg/dL — ABNORMAL HIGH (ref 0.44–1.00)
GFR calc non Af Amer: 44 mL/min — ABNORMAL LOW (ref >60)
GFR, EST AFRICAN AMERICAN: 52 mL/min — AB (ref >60)
Glucose, Bld: 188 mg/dL — ABNORMAL HIGH (ref 65–99)
Potassium: 3.3 mmol/L — ABNORMAL LOW (ref 3.5–5.1)
SODIUM: 140 mmol/L (ref 135–145)

## 2015-04-09 LAB — GLUCOSE, CAPILLARY
GLUCOSE-CAPILLARY: 172 mg/dL — AB (ref 65–99)
Glucose-Capillary: 240 mg/dL — ABNORMAL HIGH (ref 65–99)
Glucose-Capillary: 237 mg/dL — ABNORMAL HIGH (ref 65–99)
Glucose-Capillary: 203 mg/dL — ABNORMAL HIGH (ref 65–99)

## 2015-04-09 LAB — CORTISOL: CORTISOL PLASMA: 14 ug/dL

## 2015-04-09 LAB — CK: Total CK: 110 U/L (ref 38–234)

## 2015-04-09 NOTE — Progress Notes (Signed)
Echocardiogram 2D Echocardiogram has been performed.  Cassandra Thompson, Cassandra Thompson M 04/09/2015, 4:50 PM

## 2015-04-09 NOTE — Progress Notes (Signed)
Physical Therapy Treatment Patient Details Name: Cassandra Thompson MRN: [id number] DOB: January 26, 1941 Today's Date: 04/09/2015    History of Present Illness Cassandra Thompson is a 74 y.o. 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 Patient's baseline creatinine as per the family was around 1.3. Patient was found to be having Around 2.4 which was confirmed in the ER. Patient states over the last few days patient has been feeling weak. Patient has been going to neurologist for gait imbalance    PT Comments    Patient is progressing well with overall mobility and will be able to manage mobility at home with assistance from her husband. Continues to be limited by fatigue and fear of falling. BP taken in sitting- 120/58, then in standing- 94/44. Patient stated she did not feel lightheaded or dizzy but that she just felt tired.   Follow Up Recommendations  Home health PT;Supervision/Assistance - 24 hour     Equipment Recommendations  None recommended by PT    Recommendations for Other Services       Precautions / Restrictions Precautions Precautions: Fall Precaution Comments: Pt denies recent falls    Mobility  Bed Mobility               General bed mobility comments: Patient sitting EOB upon arrival  Transfers Overall transfer level: Needs assistance Equipment used: Rolling walker (2 wheeled)   Sit to Stand: Min guard         General transfer comment: Patient able to stand with MG assist and cues for safe hand placement. Stood Clinical research associatex2. Patient able to self correct posterior lean this session  Ambulation/Gait Ambulation/Gait assistance: Min assist Ambulation Distance (Feet): 50 Feet Assistive device: Rolling walker (2 wheeled) Gait Pattern/deviations: Step-through  pattern;Shuffle   Gait velocity interpretation: <1.8 ft/sec, indicative of risk for recurrent falls General Gait Details: pt with short shuffling steps. she slides her feet and decreased stride length. Cues for safe positioning and management of RW   Stairs            Wheelchair Mobility    Modified Rankin (Stroke Patients Only)       Balance     Sitting balance-Leahy Scale: Good       Standing balance-Leahy Scale: Poor Standing balance comment: Fearful and requires A to ensure balance. Use of RW to hold on to                    Cognition Arousal/Alertness: Awake/alert Behavior During Therapy: [facility] for tasks assessed/performed Overall Cognitive Status: Within Functional Limits for tasks assessed                      Exercises      General Comments        Pertinent Vitals/Pain Pain Assessment: No/denies pain    Home Living                      Prior Function            PT Goals (current goals can now be found in the care plan section) Progress towards PT goals: Progressing toward goals    Frequency  Min 3X/week    PT Plan Current plan remains appropriate    Co-evaluation  End of Session Equipment Utilized During Treatment: Gait belt Activity Tolerance: Patient limited by fatigue Patient left: in chair;with call bell/phone within reach     Time: 0749-0814 PT Time Calculation (min) (ACUTE ONLY): 25 min  Charges:  $Gait Training: 8-22 mins $Therapeutic Activity: 8-22 mins                    G Codes:      Fredrich Birks 04/09/2015, 8:17 AM 04/09/2015 Fredrich Birks PTA [phone] pager [phone] office

## 2015-04-09 NOTE — Progress Notes (Addendum)
[facility] PROGRESS NOTE  Cassandra Thompson ZOX:[id number] DOB: 05/23/1941 DOA: 04/05/2015 PCP: No PCP Per Patient  Assessment/Plan:  Active Problems:   AKI (acute kidney injury) - Improving with improved oral intake and IV fluid rehydration - discontinue IVF's as patient has had normal saline infusion since admission. She is eating and drinking. Will see how her creatinine does off of normal saline infusion. - Prerenal etiology given improvement with IVF rehydration.  Dehydration - Will continue IVF's - still having elevated BUN/Creatinine ratio    Diabetes mellitus type 2, uncontrolled -Continue sliding scale insulin and will decrease Levemir dose given recent hypoglycemic value - Will place order for diabetic diet    Generalized weakness - Most likely due to dehydration and orthostatic blood pressure changes - agree with PT evaluation    Orthostatic hypotension - Still orthostatic will continue IVF rehydration - suspect dehydration contributing - will obtain Echocardiogram - place ted hose - reassess orthostatics next am. - addendum: will obtain cortisol level and echo as listed above   Code Status: full Family Communication: Discussed with patient and family at bedside Disposition Plan: Pending improvement in condition, Still orthostatic   Consultants:  None  Procedures:  None  Antibiotics:  None  HPI/Subjective: Patient has no new complaints today, no acute issues overnight.  Objective: Filed Vitals:   04/09/15 1427  BP: 163/80  Pulse: 82  Temp: 98.6 F (37 C)  Resp: 18    Intake/Output Summary (Last 24 hours) at 04/09/15 1445 Last data filed at 04/09/15 1330  Gross per 24 hour  Intake    480 ml  Output   1800 ml  Net  -1320 ml   Filed Weights   04/07/15 0535 04/08/15 0512 04/09/15 0644  Weight: 75.7 kg (166 lb 14.2 oz) 74.6 kg (164 lb 7.4 oz) 75.1 kg (165 lb 9.1 oz)    Exam:   General:  Patient in no acute distress, alert and  awake, dry mucous membranes  Cardiovascular: Regular rate and rhythm, no murmurs or rubs  Respiratory: Clear to auscultation bilaterally, no wheezes  Abdomen: Soft, nondistended, nontender  Musculoskeletal: No cyanosis or clubbing   Data Reviewed: Basic Metabolic Panel:  Recent Labs Lab 04/05/15 1520 04/06/15 0053 04/06/15 0652 04/07/15 1409 04/09/15 0520  NA 138  --  141 139 140  K 4.5  --  3.9 4.2 3.3*  CL 103  --  107 103 110  CO2 24  --  GLUCOSE 294*  --  75 261* 188*  BUN 54*  --  41* 21* 16  CREATININE 2.40* 1.95* 1.71* 1.52* 1.19*  CALCIUM 10.2  --  9.3 8.6* 8.5*   Liver Function Tests:  Recent Labs Lab 04/05/15 1520 04/06/15 0652  AST 61* 47*  ALT 22 40  ALKPHOS 81 65  BILITOT 0.2* 0.5  PROT 7.2 6.5  ALBUMIN 3.8 3.4*   No results for input(s): LIPASE, AMYLASE in the last 168 hours. No results for input(s): AMMONIA in the last 168 hours. CBC:  Recent Labs Lab 04/05/15 1520 04/06/15 0053 04/06/15 0652  WBC 5.8 5.7 5.1  NEUTROABS 3.7  --  2.3  HGB 12.8 12.0 12.5  HCT 38.2 35.5* 37.3  MCV 93.2 92.2 92.1  PLT 150 136* 142*   Cardiac Enzymes:  Recent Labs Lab 04/06/15 0053 04/06/15 0700 04/07/15 0435 04/08/15 0625 04/09/15 0520  CKTOTAL 834* 820* 503* 217 110  TROPONINI <0.03  --   --   --   --  BNP (last 3 results) No results for input(s): BNP in the last 8760 hours.  ProBNP (last 3 results) No results for input(s): PROBNP in the last 8760 hours.  CBG:  Recent Labs Lab 04/08/15 1216 04/08/15 1634 04/08/15 2302 04/09/15 0814 04/09/15 1142  GLUCAP 252* 330* 222* 172* 240*    No results found for this or any previous visit (from the past 240 hour(s)).   Studies: No results found.  Scheduled Meds: . enoxaparin (LOVENOX) injection  40 mg Subcutaneous Q24H  . insulin aspart  0-9 Units Subcutaneous TID WC  . insulin detemir  20 Units Subcutaneous Daily   Continuous Infusions:    Time spent: > 35  minutes    Cassandra Thompson  [facility] Pager [phone]. If 7PM-7AM, please contact night-coverage at [email or web address], password [email or web address] 04/09/2015, 2:45 PM  LOS: 4 days

## 2015-04-10 LAB — BASIC METABOLIC PANEL
Anion gap: 13 (ref 5–15)
BUN: 18 mg/dL (ref 6–20)
CALCIUM: 9 mg/dL (ref 8.9–10.3)
CO2: 20 mmol/L — ABNORMAL LOW (ref 22–32)
Chloride: 106 mmol/L (ref 101–111)
Creatinine, Ser: 1.2 mg/dL — ABNORMAL HIGH (ref 0.44–1.00)
GFR calc Af Amer: 51 mL/min — ABNORMAL LOW (ref >60)
GFR, EST NON AFRICAN AMERICAN: 44 mL/min — AB (ref >60)
Glucose, Bld: 194 mg/dL — ABNORMAL HIGH (ref 65–99)
Potassium: 3.1 mmol/L — ABNORMAL LOW (ref 3.5–5.1)
Sodium: 139 mmol/L (ref 135–145)

## 2015-04-10 LAB — CK: CK TOTAL: 74 U/L (ref 38–234)

## 2015-04-10 LAB — GLUCOSE, CAPILLARY
GLUCOSE-CAPILLARY: 312 mg/dL — AB (ref 65–99)
Glucose-Capillary: 200 mg/dL — ABNORMAL HIGH (ref 65–99)
Glucose-Capillary: 284 mg/dL — ABNORMAL HIGH (ref 65–99)

## 2015-04-10 LAB — PATHOLOGIST SMEAR REVIEW

## 2015-04-10 MED ORDER — LEVEMIR 100 UNIT/ML ~~LOC~~ SOLN: 20.0000 [IU] | Freq: Every day | SUBCUTANEOUS | Status: AC

## 2015-04-10 NOTE — Discharge Summary (Signed)
Physician Discharge Summary  Cassandra Thompson VOZ:[id number] DOB: [birth year] DOA: 04/05/2015  PCP: No PCP Per Patient  Admit date: 04/05/2015 Discharge date: 04/10/2015  Time spent: > 35 minutes  Recommendations for Outpatient Follow-up:  1. Monitor blood pressures 2. Recommend pressure stockings  3. Monitor serum creatinine within the next week if more than 1.4 or 1.4 discontinue metformin  Discharge Diagnoses:  Active Problems:   AKI (acute kidney injury)   Diabetes mellitus type 2, uncontrolled   Generalized weakness   Orthostatic hypotension   Acute renal failure   Discharge Condition: stable  Diet recommendation: Carb modified  Filed Weights   04/08/15 0512 04/09/15 0644 04/10/15 0605  Weight: 74.6 kg (164 lb 7.4 oz) 75.1 kg (165 lb 9.1 oz) 75.13 kg (165 lb 10.1 oz)    History of present illness:  74 year old with history of diabetes mellitus type 2, lower extremity edema on Lasix, Parkinson's, left eye shingles. Who presented to the hospital with elevated serum creatinine. Also found to have orthostatic hypotension.  Hospital Course:  Dehydration - Resolved with improved oral intake and IV fluid rehydration  Orthostatic hypotension - Patient has markedly decreased in systolic blood pressure. However after several days of IV fluid rehydration and improved oral intake patient has not been having any dizziness or weakness upon standing. - We'll discontinue Lasix on discharge - Recommended compression stockings - Discharge with home health physical therapy - Echocardiogram reviewed and reporting normal EF with grade 1 diastolic dysfunction  Acute kidney injury - Resolved after IV fluid rehydration was due to prerenal causes given dehydration.  DM - Serum creatinine 1.2 such will continue metformin. - Continue Levemir at decreased dose - May be contributing to autonomic dysfunction  Procedures:  Echocardiogram  Consultations:  None  Discharge Exam: Filed  Vitals:   04/10/15 1508  BP: 179/70  Pulse: 82  Temp: 99 F (37.2 C)  Resp: 22    General: Patient in no acute distress, alert and awake Cardiovascular: Regular rate and rhythm, no murmurs or rubs Respiratory: Clear to auscultation bilaterally, no wheezes  Discharge Instructions   Discharge Instructions    Call MD for:  extreme fatigue    Complete by:  As directed      Call MD for:  redness, tenderness, or signs of infection (pain, swelling, redness, odor or green/yellow discharge around incision site)    Complete by:  As directed      Call MD for:  temperature >100.4    Complete by:  As directed      Diet - low sodium heart healthy    Complete by:  As directed      Discharge instructions    Complete by:  As directed   Please monitor blood pressures. Follow up with your primary care physician in 1-2 weeks or sooner should any new concerns.     Increase activity slowly    Complete by:  As directed           Current Discharge Medication List    CONTINUE these medications which have CHANGED   Details  LEVEMIR 100 UNIT/ML injection Inject 0.2 mLs (20 Units total) into the skin at bedtime. Qty: 10 mL, Refills: 3      CONTINUE these medications which have NOT CHANGED   Details  aspirin EC 81 MG tablet Take 81 mg by mouth daily.    brimonidine (ALPHAGAN) 0.15 % ophthalmic solution Place 1 drop into both eyes 3 (three) times daily. Refills: 4    brinzolamide (  AZOPT) 1 % ophthalmic suspension Apply 1 drop to eye 2 (two) times daily.    calcium carbonate (OS-CAL) 600 MG TABS tablet Take 600 mg by mouth daily with breakfast.    Cholecalciferol 1000 UNITS tablet Take 1,000 mg by mouth daily.    Cyanocobalamin (VITAMIN B-12) 1000 MCG SUBL Take 1,000 mg by mouth daily.    diphenoxylate-atropine (LOMOTIL) 2.5-0.025 MG per tablet Take 1 tablet by mouth daily as needed. stool    erythromycin ophthalmic ointment Place 1 application into the left eye daily. Refills: 0     ferrous sulfate 325 (65 FE) MG tablet Take 325 mg by mouth daily.    HUMALOG 100 UNIT/ML injection Inject 18 Units into the skin See admin instructions. Per sliding scale 18 units with breakfast, 18 units with lunch, 20 units with dinner, max 55 unit per day Refills: 3    metFORMIN (GLUCOPHAGE-XR) 500 MG 24 hr tablet Take 500 mg by mouth 2 (two) times daily.    Multiple Vitamin (MULTI-VITAMINS) TABS Take 1 tablet by mouth daily.    omeprazole (PRILOSEC) 20 MG capsule Take 20 mg by mouth 2 (two) times daily. Refills: 1    predniSONE (DELTASONE) 5 MG tablet Take 5 mg by mouth daily. Refills: 0    Propylene Glycol 0.6 % SOLN Place 1 drop into both eyes 2 (two) times daily.    sucralfate (CARAFATE) 1 GM/10ML suspension Take 1 g by mouth daily.    trifluridine (VIROPTIC) 1 % ophthalmic solution Place 1 drop into the left eye 2 (two) times daily. 2x day for 4 days starting 04-09-15, then 1x day for 4 days then stop. Refills: 1    VENTOLIN HFA 108 (90 BASE) MCG/ACT inhaler Inhale 2 puffs into the lungs every 4 (four) hours as needed. Wheezing Refills: 0      STOP taking these medications     furosemide (LASIX) 20 MG tablet        Not on File    The results of significant diagnostics from this hospitalization (including imaging, microbiology, ancillary and laboratory) are listed below for reference.    Significant Diagnostic Studies: US Renal  04/06/2015   CLINICAL DATA:  Acute kidney injury  EXAM: RENAL / URINARY TRACT ULTRASOUND COMPLETE  COMPARISON:  Abdominal CT 10/25/2014  FINDINGS: Right Kidney:  Length: 10 cm. Echogenicity within normal limits. No mass or hydronephrosis visualized. Color Doppler not applied.  Left Kidney:  Length: 10 cm. Echogenicity within normal limits. No mass or hydronephrosis visualized. Color Doppler not applied.  Bladder:  1000 cc bladder volume.  No wall thickening or internal debris.  IMPRESSION: 1. Negative renal ultrasound. 2. 1000 cc bladder  volume.   Electronically Signed   By: Marnee Spring M.D.   On: 04/06/2015 01:22    Microbiology: No results found for this or any previous visit (from the past 240 hour(s)).   Labs: Basic Metabolic Panel:  Recent Labs Lab 04/05/15 1520 04/06/15 0053 04/06/15 0652 04/07/15 1409 04/09/15 0520 04/10/15 0515  NA 138  --  141 139 140 139  K 4.5  --  3.9 4.2 3.3* 3.1*  CL 103  --  107 103 110 106  CO2 24  --  20*  GLUCOSE 294*  --  75 261* 188* 194*  BUN 54*  --  41* 21* 16 18  CREATININE 2.40* 1.95* 1.71* 1.52* 1.19* 1.20*  CALCIUM 10.2  --  9.3 8.6* 8.5* 9.0   Liver Function Tests:  Recent  Labs Lab 04/05/15 1520 04/06/15 0652  AST 61* 47*  ALT 22 40  ALKPHOS 81 65  BILITOT 0.2* 0.5  PROT 7.2 6.5  ALBUMIN 3.8 3.4*   No results for input(s): LIPASE, AMYLASE in the last 168 hours. No results for input(s): AMMONIA in the last 168 hours. CBC:  Recent Labs Lab 04/05/15 1520 04/06/15 0053 04/06/15 0652  WBC 5.8 5.7 5.1  NEUTROABS 3.7  --  2.3  HGB 12.8 12.0 12.5  HCT 38.2 35.5* 37.3  MCV 93.2 92.2 92.1  PLT 150 136* 142*   Cardiac Enzymes:  Recent Labs Lab 04/06/15 0053 04/06/15 0700 04/07/15 0435 04/08/15 0625 04/09/15 0520 04/10/15 0515  CKTOTAL 834* 820* 503* 217 110 74  TROPONINI <0.03  --   --   --   --   --    BNP: BNP (last 3 results) No results for input(s): BNP in the last 8760 hours.  ProBNP (last 3 results) No results for input(s): PROBNP in the last 8760 hours.  CBG:  Recent Labs Lab 04/09/15 1142 04/09/15 1726 04/09/15 2141 04/10/15 0757 04/10/15 1152  GLUCAP 240* 237* 203* 200* 312*    Signed:  [name]  [facility] 04/10/2015, 4:27 PM

## 2015-04-10 NOTE — Care Management Note (Signed)
Case Management Note  Patient Details  Name: Norval Mortonsther Waitman MRN: [id number] Date of Birth: [birth year]  Subjective/Objective:      [facility] for dc today, [facility], Miranda notified. Soc will begin 24-48hrs post dc.              Action/Plan:   Expected Discharge Date:                  Expected Discharge Plan:  Home w Home Health Services  In-House Referral:     Discharge planning Services  CM Consult  Post Acute Care Choice:    Choice offered to:  Patient  DME Arranged:    DME Agency:     HH Arranged:  PT HH Agency:  [facility]  Status of Service:  Completed, signed off  Medicare Important Message Given:  Yes Date Medicare IM Given:  04/09/15 Medicare IM give by:  debbie swist rn bsn cm Date Additional Medicare IM Given:    Additional Medicare Important Message give by:     If discussed at Long Length of Stay Meetings, dates discussed:    Additional Comments:  Leone Havenaylor, Guage Efferson Clinton, RN 04/10/2015, 4:37 PM

## 2015-04-10 NOTE — Progress Notes (Signed)
Inpatient Diabetes Program Recommendations  AACE/ADA: New Consensus Statement on Inpatient Glycemic Control (2013)  Target Ranges:  Prepandial:   less than 140 mg/dL      Peak postprandial:   less than 180 mg/dL (1-2 hours)      Critically ill patients:  140 - 180 mg/dL     Results for Norval MortonBENNETT, Alfred (MRN [id number]) as of 04/10/2015 08:52  Ref. Range 04/08/2015 08:04 04/08/2015 12:16 04/08/2015 16:34 04/08/2015 23:02  Glucose-Capillary Latest Ref Range: 65-99 mg/dL 782228 (H) 956252 (H) 213330 (H) 222 (H)    Results for Norval MortonBENNETT, Tanaia (MRN [id number]) as of 04/10/2015 08:52  Ref. Range 04/09/2015 08:14 04/09/2015 11:42 04/09/2015 17:26 04/09/2015 21:41  Glucose-Capillary Latest Ref Range: 65-99 mg/dL 629172 (H) 528240 (H) 413237 (H) 203 (H)    Results for Norval MortonBENNETT, Leonor (MRN [id number]) as of 04/10/2015 08:52  Ref. Range 04/10/2015 07:57  Glucose-Capillary Latest Ref Range: 65-99 mg/dL 536200 (H)     Home DM Meds: Levemir 45 units QHS       Humalog 18 units breakfast/ 18 units lunch/ 20 units supper  Current DM Orders: Levemir 20 units daily            Novolog Sensitive SSI    **Note patient had single Hypoglycemic event on 05/27.  Levemir insulin was reduced by half.  Since then, patient has been having consistently elevated glucose levels.  Currently only receiving partial doses of home insulin therapy.  **Patient is eating 50-75% of meals at present.    MD- Please consider the following in-hospital insulin adjustments:  1. Increase Levemir to 30 units daily  2. Add a portion of home Meal Coverage- Novolog 4 units tid with meals (~25% of home dose of Meal Coverage)     Will follow Ambrose FinlandJeannine Johnston Kyra Laffey RN, MSN, CDE Diabetes Coordinator Inpatient Diabetes Program Team Pager: [phone] (8a-5p)

## 2015-04-10 NOTE — Progress Notes (Signed)
Cassandra MortonEsther Krieger to be D/C'd Home per MD order.  Discussed with the patient and all questions fully answered.  VSS, Skin clean, dry and intact without evidence of skin break down, no evidence of skin tears noted. IV catheter discontinued intact. Site without signs and symptoms of complications. Dressing and pressure applied.  An After Visit Summary was printed and given to the patient. Patient received prescription.  D/c education completed with patient/family including follow up instructions, medication list, d/c activities limitations if indicated, with other d/c instructions as indicated by MD - patient able to verbalize understanding, all questions fully answered.   Patient instructed to return to ED, call 911, or call MD for any changes in condition.   Patient escorted via WC, and D/C home via private auto.  [name], [name] F 04/10/2015 5:47 PM

## 2016-08-15 IMAGING — US US RENAL
1 series · 14 of 16 positions shown · non-contrast
Comparison: Abdominal CT 10/25/2014

CLINICAL DATA: Acute kidney injury

EXAM:
RENAL / URINARY TRACT ULTRASOUND COMPLETE

[Series 1: us renal · 0.21mm/px · 14 of 16 slices shown]
[im 1/16]
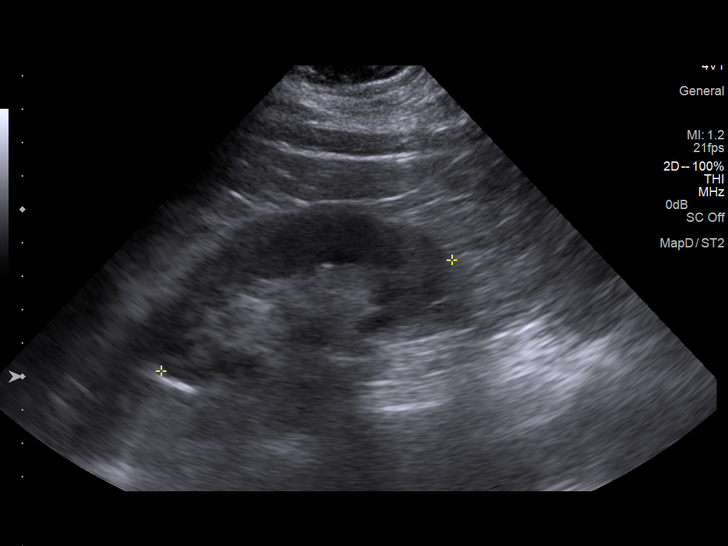
[im 2/16]
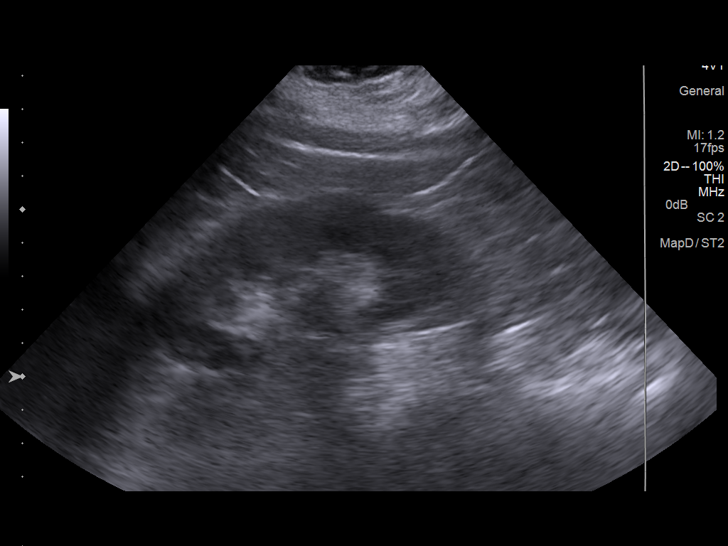
[im 3/16]
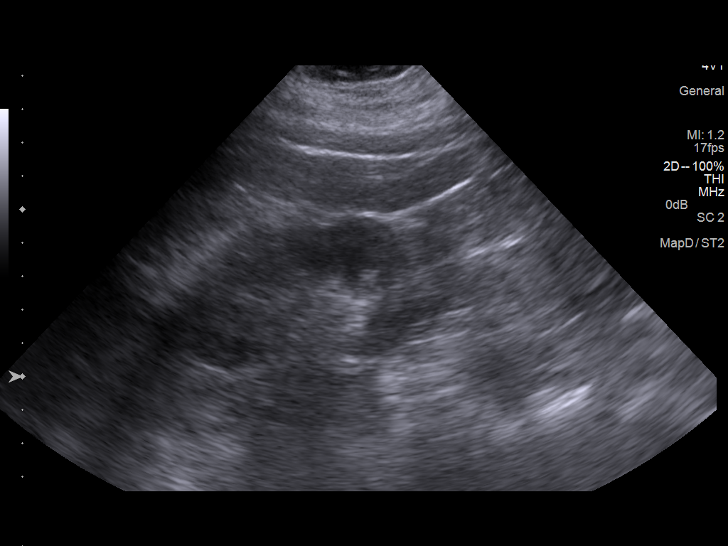
[im 5/16]
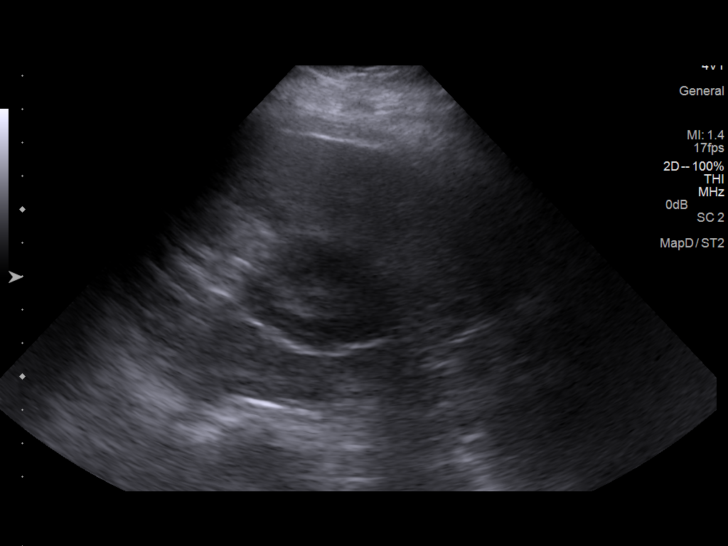
[im 6/16]
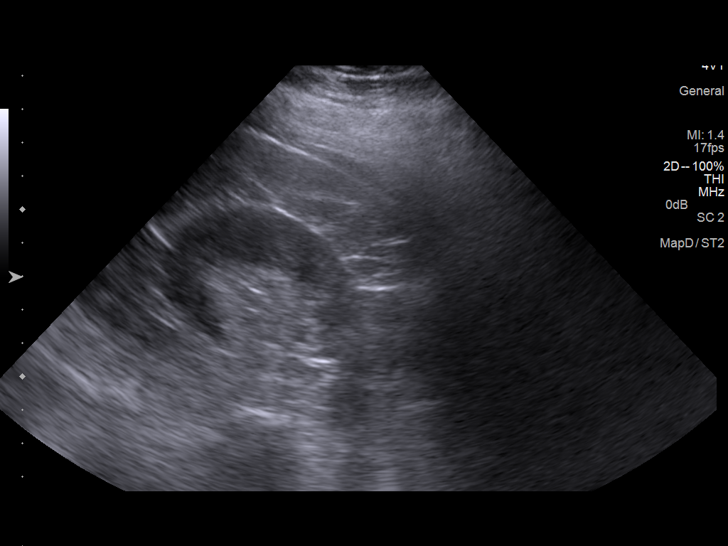
[im 7/16]
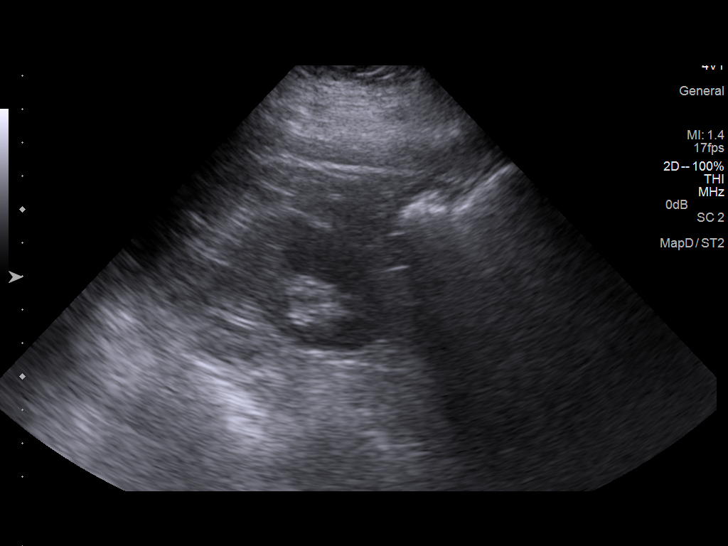
[im 8/16]
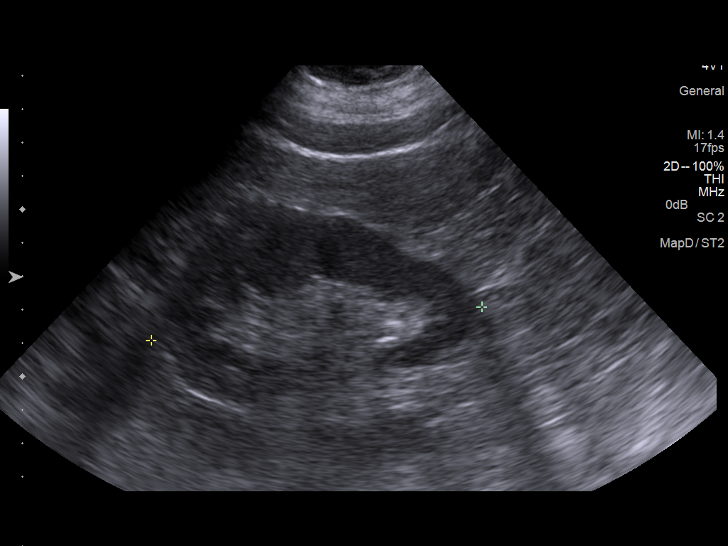
[im 9/16]
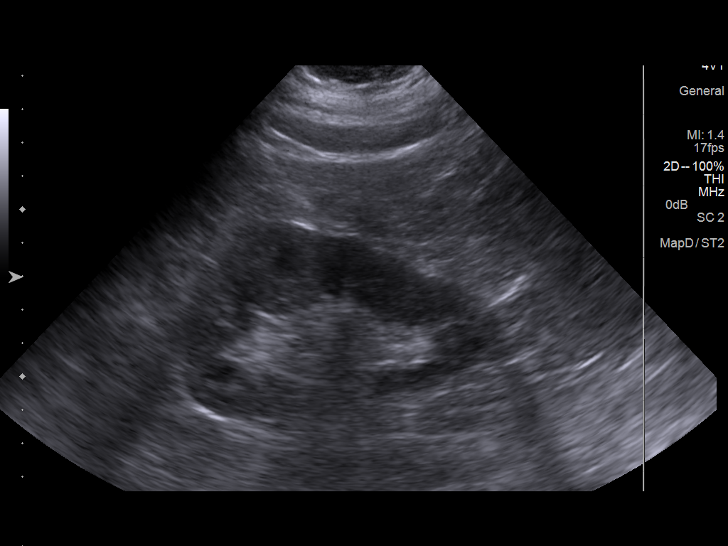
[im 10/16]
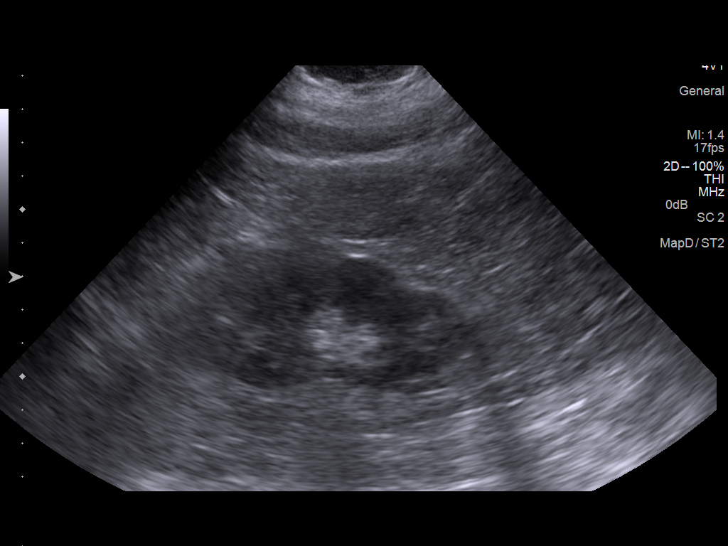
[im 11/16]
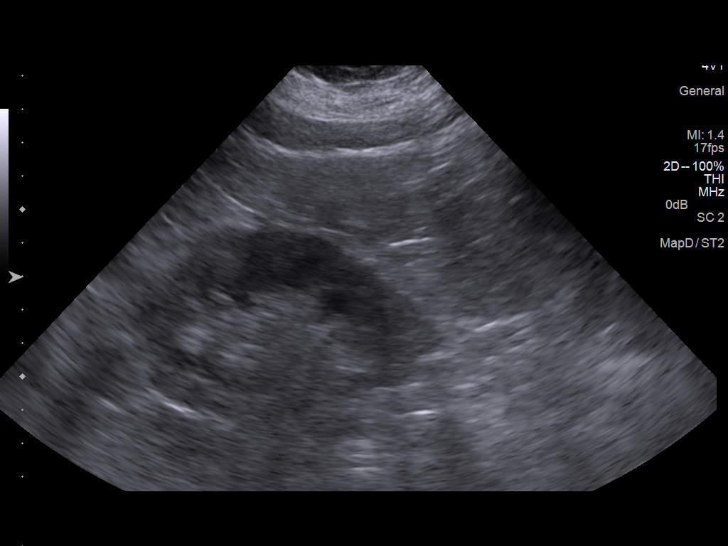
[im 13/16]
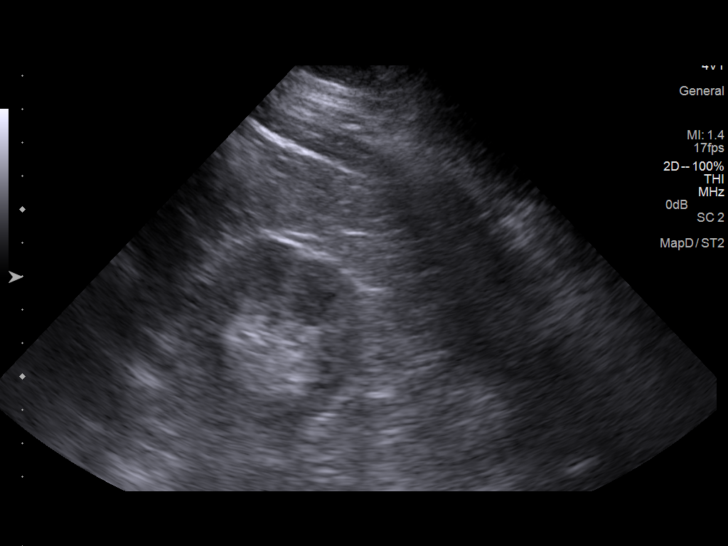
[im 14/16]
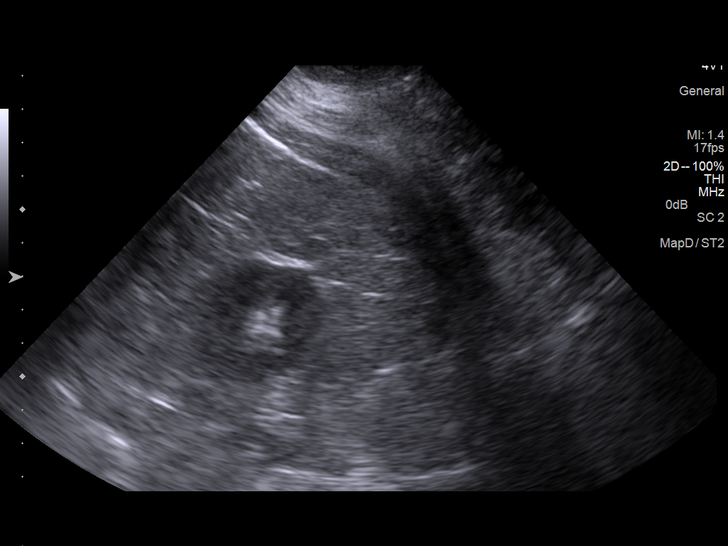
[im 15/16]
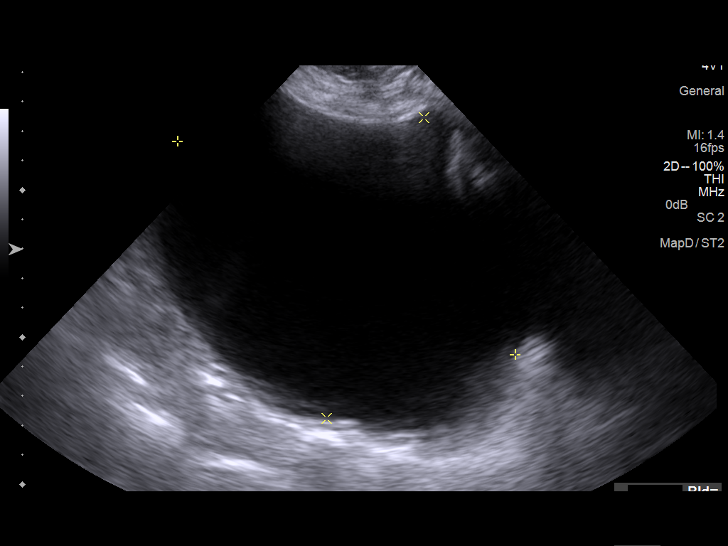
[im 16/16]
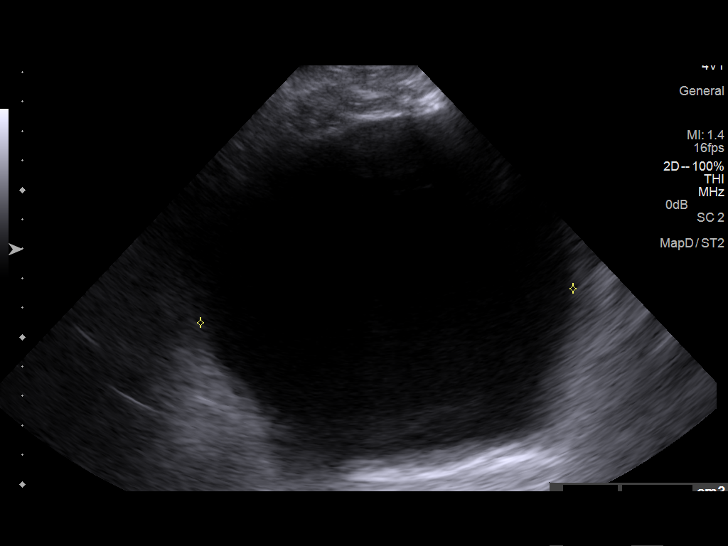

[14 of 16 positions shown; findings below may reference images not displayed]

FINDINGS: Right Kidney:

Length: 10 cm. Echogenicity within normal limits. No mass or
hydronephrosis visualized. Color Doppler not applied.

Left Kidney:

Length: 10 cm. Echogenicity within normal limits. No mass or
hydronephrosis visualized. Color Doppler not applied.

Bladder:

8444 cc bladder volume.  No wall thickening or internal debris.
IMPRESSION: 1. Negative renal ultrasound.
2. 8444 cc bladder volume.

## 2017-01-08 DEATH — deceased
